# Patient Record
Sex: Female | Born: 2000 | Race: White | Hispanic: No | Marital: Single | Smoking: Never smoker
Health system: Southern US, Community
[De-identification: ages and names within clinical notes are randomized; demographics above are authoritative.]

## PROBLEM LIST (undated history)

## (undated) DIAGNOSIS — F5000 Anorexia nervosa, unspecified: Secondary | ICD-10-CM

## (undated) DIAGNOSIS — F5025 Bulimia nervosa, in remission: Secondary | ICD-10-CM

## (undated) DIAGNOSIS — L709 Acne, unspecified: Secondary | ICD-10-CM

## (undated) DIAGNOSIS — H539 Unspecified visual disturbance: Secondary | ICD-10-CM

## (undated) DIAGNOSIS — K436 Other and unspecified ventral hernia with obstruction, without gangrene: Secondary | ICD-10-CM

## (undated) DIAGNOSIS — T7840XA Allergy, unspecified, initial encounter: Secondary | ICD-10-CM

## (undated) DIAGNOSIS — F502 Bulimia nervosa: Secondary | ICD-10-CM

## (undated) HISTORY — DX: Anorexia nervosa, unspecified: F50.00

## (undated) HISTORY — DX: Bulimia nervosa, in remission: F50.25

## (undated) HISTORY — DX: Bulimia nervosa: F50.2

---
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
## 2021-01-16 IMAGING — US US BREAST*R* LIMITED INC AXILLA
1 series · 4 of 4 positions shown · non-contrast
Comparison: None.

CLINICAL DATA: Palpable abnormality and associated tenderness in
the UPPER-OUTER QUADRANT of the RIGHT breast.

EXAM:
ULTRASOUND OF THE RIGHT BREAST

[Series 1: us breast*right* limited inc axilla · 0.06mm/px · 4 of 4 slices shown]
[im 1/4]
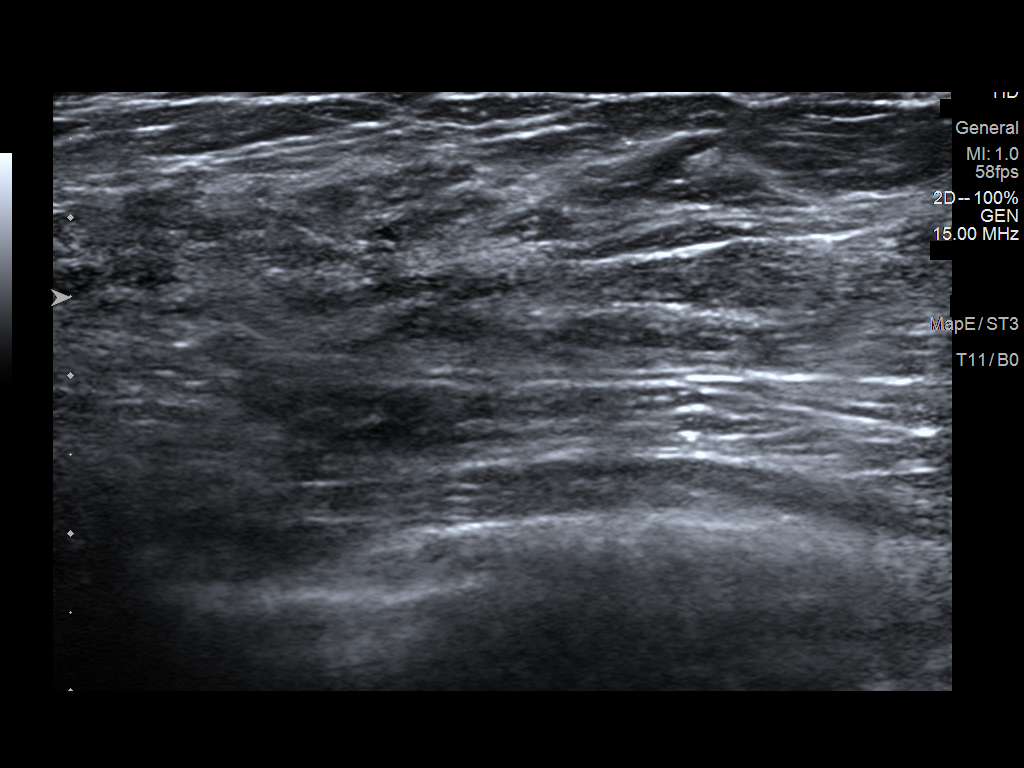
[im 2/4]
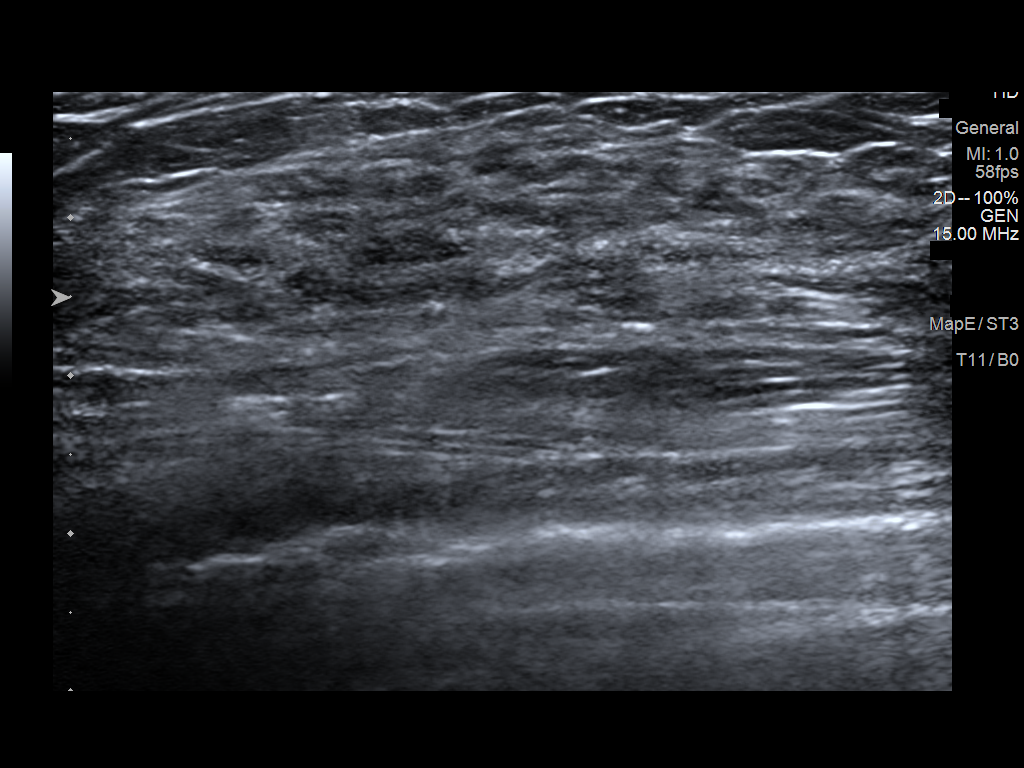
[im 3/4]
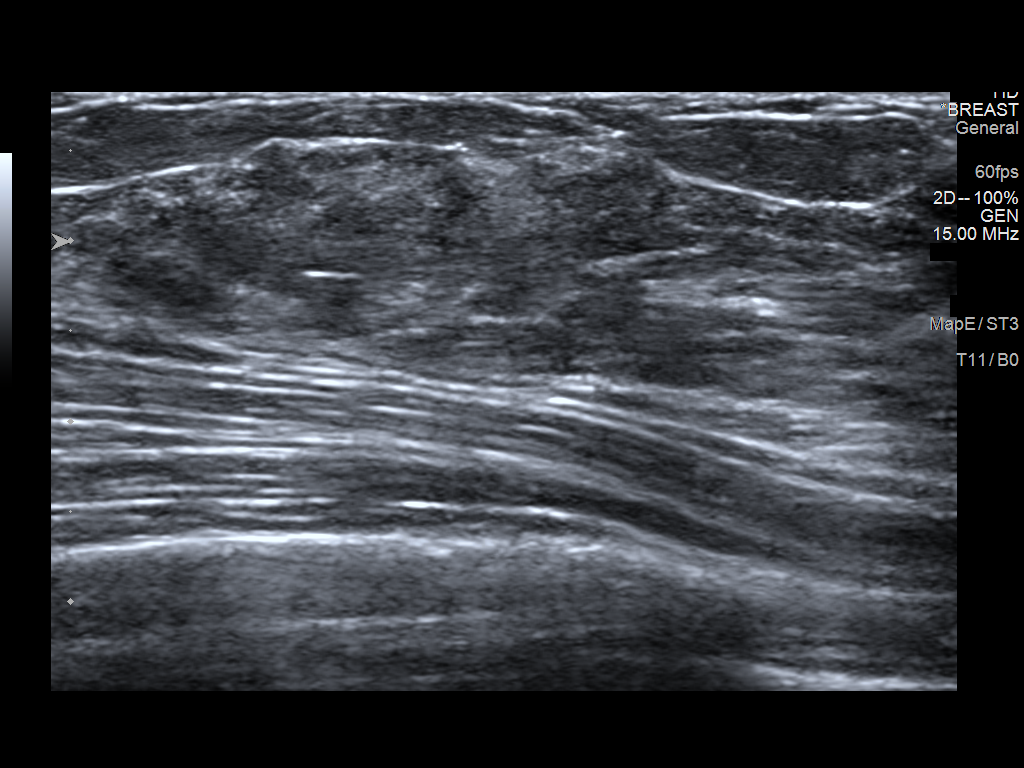
[im 4/4]
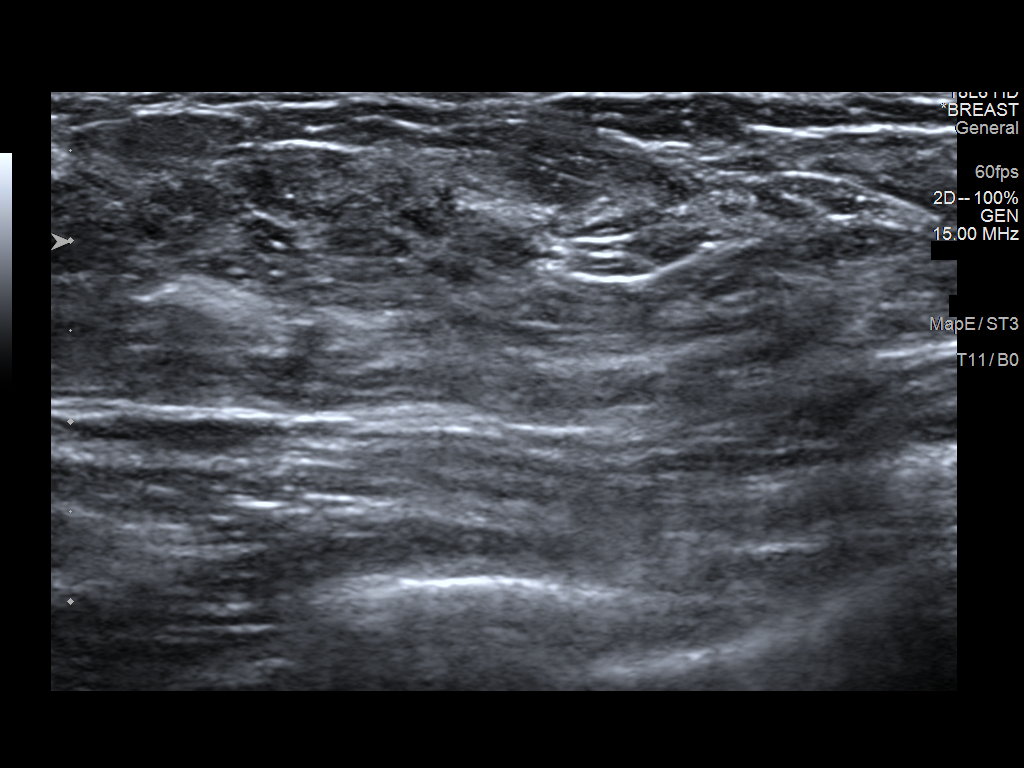

[4 of 4 positions shown; findings below may reference images not displayed]

FINDINGS: On physical exam, I palpate soft non focal thickening in the
UPPER-OUTER QUADRANT of the RIGHT breast, mildly tender on exam.
There is no erythema or skin thickening.

Targeted ultrasound is performed, showing normal appearing dense
fibroglandular tissue in the UPPER-OUTER QUADRANT of the RIGHT
breast. No suspicious mass, distortion, or acoustic shadowing is
demonstrated with ultrasound.
IMPRESSION: There is no sonographic evidence for malignancy.

RECOMMENDATION:
Clinical follow-up as needed.

Screening mammogram at age 41 unless there are persistent or
intervening clinical concerns. (Code:IZ-K-R0N)

I have discussed the findings and recommendations with the patient
and her mother. If applicable, a reminder letter will be sent to the
patient regarding the next appointment.

BI-RADS CATEGORY  1: Negative.

## 2023-12-16 ENCOUNTER — Encounter: Payer: Self-pay | Admitting: Podiatry

## 2023-12-16 ENCOUNTER — Ambulatory Visit (INDEPENDENT_AMBULATORY_CARE_PROVIDER_SITE_OTHER): Payer: Self-pay | Admitting: Podiatry

## 2023-12-16 VITALS — Ht 62.5 in | Wt 128.0 lb

## 2023-12-16 DIAGNOSIS — B351 Tinea unguium: Secondary | ICD-10-CM | POA: Diagnosis not present

## 2023-12-16 MED ORDER — TERBINAFINE HCL 250 MG PO TABS
250.0000 mg | ORAL_TABLET | Freq: Every day | ORAL | 0 refills | Status: AC
Start: 2023-12-16 — End: ?

## 2023-12-17 NOTE — Progress Notes (Signed)
 Subjective:   Patient ID: Abigail Estrada Billing, female   DOB: 23 y.o.   MRN: [id number]   HPI Patient presents with chronic deformity of the  hallux nail and states has been this way for a number of years and we had seen her approximately 6 years ago for condition.  States that it is not really hurting but it is a  damaged  nail patient does not smoke likes to be active   Review of Systems  All other systems reviewed and are negative.       Objective:  Physical Exam Vitals and nursing note reviewed.  Constitutional:      Appearance: She is well-developed.  Pulmonary:     Effort: Pulmonary effort is normal.  Musculoskeletal:        General: Normal range of motion.  Skin:    General: Skin is warm.  Neurological:     Mental Status: She is alert.     Neurovascular status found to be intact muscle strength was found to be adequate range of motion within normal limits with patient found to have a thickened nailbed that has sustained damage has been present for a number of years is not loose currently and is only mildly discomforting.  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  At this point we are going to try to save the nail and improve the condition even though ultimate permanent nail removal may be necessary.  Will start patient on oral Lamisil 1 pill/day 90 days she has had blood work indicating good liver function and will start laser therapy and they can have several nails done and she will take all polish off for evaluation.  All questions answered risk factor and the fact that there is no guarantee this will improve the condition

## 2023-12-18 ENCOUNTER — Ambulatory Visit: Admitting: Podiatry

## 2024-02-04 ENCOUNTER — Ambulatory Visit (INDEPENDENT_AMBULATORY_CARE_PROVIDER_SITE_OTHER): Payer: Self-pay

## 2024-02-04 DIAGNOSIS — B351 Tinea unguium: Secondary | ICD-10-CM

## 2024-02-04 NOTE — Progress Notes (Signed)
 Patient presents today for the 1st laser treatment. Diagnosed with mycotic nail infection by Dr. Magdalen.   Toenail most affected right 1st hallux. Patient has started oral Lamisil  treatment. She has already noticed improvement in the skin around the nail.   All other systems are negative.  Nails were filed thin. Laser therapy was administered to 1st toenail right foot and patient tolerated the treatment well. All safety precautions were in place.   Post treatment instructions reviewed and provided to patient. Patient had no questions regarding plan of care.   Follow up in 4 weeks for laser # 2.

## 2024-02-04 NOTE — Patient Instructions (Signed)

## 2024-03-03 ENCOUNTER — Ambulatory Visit (INDEPENDENT_AMBULATORY_CARE_PROVIDER_SITE_OTHER): Payer: Self-pay

## 2024-03-03 DIAGNOSIS — B351 Tinea unguium: Secondary | ICD-10-CM

## 2024-03-03 NOTE — Progress Notes (Unsigned)
 Patient presents today for the 2nd laser treatment. Diagnosed with mycotic nail infection by Dr. Magdalen.   Toenail most affected Right 1st hallux.    All other systems are negative.  Nails were filed thin. Laser therapy was administered to 1st toenail Right foot and patient tolerated the treatment well. All safety precautions were in place.   Post treatment instructions reviewed and provided to patient. Patient had no questions regarding plan of care.   Follow up in 4 weeks for laser # 3.

## 2024-03-31 ENCOUNTER — Ambulatory Visit

## 2024-05-01 ENCOUNTER — Other Ambulatory Visit

## 2024-05-12 ENCOUNTER — Ambulatory Visit

## 2024-06-23 ENCOUNTER — Ambulatory Visit
